# Patient Record
Sex: Male | Born: 1980 | Race: White | Hispanic: No | Marital: Married | Smoking: Former smoker
Health system: Southern US, Community
[De-identification: ages and names within clinical notes are randomized; demographics above are authoritative.]

## PROBLEM LIST (undated history)

## (undated) DIAGNOSIS — J302 Other seasonal allergic rhinitis: Secondary | ICD-10-CM

## (undated) HISTORY — PX: SHOULDER SURGERY: SHX246

## (undated) HISTORY — PX: WRIST SURGERY: SHX841

---
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
## 2014-09-25 ENCOUNTER — Other Ambulatory Visit: Payer: Self-pay | Admitting: Orthopaedic Surgery

## 2014-09-25 DIAGNOSIS — M25512 Pain in left shoulder: Secondary | ICD-10-CM

## 2014-10-04 ENCOUNTER — Ambulatory Visit
Admission: RE | Admit: 2014-10-04 | Discharge: 2014-10-04 | Disposition: A | Discharge status: Home / Self Care | Payer: BC Managed Care – PPO | Source: Ambulatory Visit | Attending: Orthopaedic Surgery | Admitting: Orthopaedic Surgery

## 2014-10-04 ENCOUNTER — Other Ambulatory Visit: Payer: Self-pay | Admitting: Orthopaedic Surgery

## 2014-10-04 DIAGNOSIS — M25512 Pain in left shoulder: Secondary | ICD-10-CM

## 2014-10-04 DIAGNOSIS — T1590XA Foreign body on external eye, part unspecified, unspecified eye, initial encounter: Secondary | ICD-10-CM

## 2014-10-04 MED ORDER — IOHEXOL 180 MG/ML  SOLN
15.0000 mL | Freq: Once | INTRAMUSCULAR | Status: AC | PRN
  Administered 2014-10-04: 15 mL via INTRA_ARTICULAR

## 2017-08-09 ENCOUNTER — Ambulatory Visit (INDEPENDENT_AMBULATORY_CARE_PROVIDER_SITE_OTHER): Payer: BC Managed Care – PPO | Admitting: Orthopaedic Surgery

## 2017-08-09 ENCOUNTER — Encounter (INDEPENDENT_AMBULATORY_CARE_PROVIDER_SITE_OTHER): Payer: Self-pay | Admitting: Orthopaedic Surgery

## 2017-08-09 DIAGNOSIS — M79622 Pain in left upper arm: Secondary | ICD-10-CM

## 2017-08-09 MED ORDER — PREDNISONE 10 MG (21) PO TBPK: ORAL_TABLET | ORAL | 0 refills | Status: DC

## 2017-08-09 NOTE — Progress Notes (Signed)
   Office Visit Note   Patient: Jeremiah Gray           Date of Birth: [birth year]           MRN: [id number] Visit Date: 08/09/2017              Requested by: No referring provider defined for this encounter. PCP: Patient, [name]   Assessment & Plan: Visit Diagnoses:  1. Left upper arm pain     Plan: Impression is left upper arm strain and overuse.  Recommend relative rest.  Prescription for prednisone.  Advil as needed.  Follow-up as needed.  Follow-Up Instructions: Return if symptoms worsen or fail to improve.   Orders:  No orders of the defined types were placed in this encounter.  Meds ordered this encounter  Medications  . predniSONE (STERAPRED UNI-PAK 21 TAB) 10 MG (21) TBPK tablet    Sig: Take as directed    Dispense:  21 tablet    Refill:  0      Procedures: No procedures performed   Clinical Data: No additional findings.   Subjective: Chief Complaint  Patient presents with  . Left Forearm - Pain    Patient is a 36 year old gentleman who I know from treating his labral tear comes in with a new complaint of discomfort in his proximal forearm and medial biceps.  He noticed this while weightlifting.  He has been taking anti-inflammatories and muscle relaxers which have not significantly helped.  Denies any numbness and tingling.  He denies any swelling or bruising.    Review of Systems  Constitutional: Negative.   All other systems reviewed and are negative.    Objective: Vital Signs: There were no vitals taken for this visit.  Physical Exam  Constitutional: He is oriented to person, place, and time. He appears well-developed and well-nourished.  Pulmonary/Chest: Effort normal.  Abdominal: Soft.  Neurological: He is alert and oriented to person, place, and time.  Skin: Skin is warm.  Psychiatric: He has a normal mood and affect. His behavior is normal. Judgment and thought content normal.  Nursing note and vitals reviewed.   Ortho Exam Left  exam shows normal hook test.  He has full strength with elbow flexion and extension.  He has normal strength with supination without pain.  He has tenderness along the medial intermuscular septum and along the ulnar nerve.  The cubital tunnel is stable to palpation.  There is no subluxation of the ulnar nerve. Specialty Comments:  No specialty comments available.  Imaging: No results found.   PMFS History: There are no active problems to display for this patient.  History reviewed. No pertinent past medical history.  History reviewed. No pertinent family history.  History reviewed. No pertinent surgical history. Social History   Occupational History  . Not on file  Tobacco Use  . Smoking status: Never Smoker  . Smokeless tobacco: Never Used  Substance and Sexual Activity  . Alcohol use: Not on file  . Drug use: Not on file  . Sexual activity: Not on file

## 2019-02-24 ENCOUNTER — Ambulatory Visit (INDEPENDENT_AMBULATORY_CARE_PROVIDER_SITE_OTHER): Payer: BC Managed Care – PPO | Admitting: Orthopaedic Surgery

## 2019-02-24 ENCOUNTER — Encounter: Payer: Self-pay | Admitting: Orthopaedic Surgery

## 2019-02-24 ENCOUNTER — Other Ambulatory Visit: Payer: Self-pay

## 2019-02-24 ENCOUNTER — Ambulatory Visit: Payer: Self-pay

## 2019-02-24 DIAGNOSIS — M25532 Pain in left wrist: Secondary | ICD-10-CM | POA: Diagnosis not present

## 2019-02-24 NOTE — Progress Notes (Signed)
Subjective: He is here for ultrasound-guided left wrist TFC injection.  Pain on the ulnar side of his wrist for about a month after doing a lifting activity.  History of previous tear treated with surgery successfully probably 18 years ago.  Objective: Tender to palpation directly over the TFC.  No significant pain over the ECU tendon.  Imaging: Ultrasound used on the ulnar side of his wrist show intact ECU tendon with no tenosynovitis.  The TFC is visualized and appears to have a hypoechoic effect in the deeper part of the structure.  This correlates to the location of his pain.  Procedure: Guided left wrist TFC injection: After sterile prep with Betadine, injected 1 cc 1% lidocaine without epinephrine and 20 mg methylprednisolone and to guide needle placement.  Follow-up as directed.

## 2019-02-24 NOTE — Progress Notes (Signed)
   Office Visit Note   Patient: Jeremiah Gray           Date of Birth: [birth year]           MRN: [id number] Visit Date: 02/24/2019              Requested by: No referring provider defined for this encounter. PCP: Patient, [name]   Assessment & Plan: Visit Diagnoses:  1. Pain in left wrist     Plan: Impression is questionable TFCC injury left wrist.  We have offered the patient the choice of proceeding with cortisone injection versus MR arthrogram to further assess the TFCC.  He would like to try the injection first.  We will refer the patient to Dr. Prince Rome for an ultrasound-guided cortisone injection to the TFCC.  He will follow-up with [place] as needed.  Call with concerns or questions in the meantime  Follow-Up Instructions: Return if symptoms worsen or fail to improve.   Orders:  Orders Placed This Encounter  Procedures  . XR Wrist Complete Left   No orders of the defined types were placed in this encounter.     Procedures: No procedures performed   Clinical Data: No additional findings.   Subjective: Chief Complaint  Patient presents with  . Left Wrist - Pain    HPI patient is a pleasant 38 year old right-hand-dominant gentleman who presents our clinic today with recurrent left wrist pain.  History of TFCC tear with subsequent surgical intervention by Dr. Nedra Hai at [facility] in 2007.  He is doing well during the postoperative period until recently.  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  The pain he has is intermittent and worse with turning doorknobs as well as resistance of the wrist.  He has noticed decreased grip strength secondary to pain and weakness.  He gets some relief with stretching exercises.  He does note an occasional tingle to the ulnar side of his wrist.  Review of Systems as detailed in HPI.  All others reviewed and are negative.   Objective: Vital  Signs: There were no vitals taken for this visit.  Physical Exam well-developed and well-nourished gentleman in no acute distress.  Alert and oriented x3.  Ortho Exam examination of his right wrist reveals increased ulnar-sided pain with wrist flexion, extension and radial deviation.  He has slight pain as well with full supination.  He has mild to moderate tenderness over the TFCC.  Negative Phalen and negative Tinel at the wrist and elbow.  He is neurovascularly intact distally.  Specialty Comments:  No specialty comments available.  Imaging: Xr Wrist Complete Left  Result Date: 02/24/2019 No acute or structural abnormalities    PMFS History: Patient Active Problem List   Diagnosis Date Noted  . Pain in left wrist 02/24/2019   History reviewed. No pertinent past medical history.  History reviewed. No pertinent family history.  History reviewed. No pertinent surgical history. Social History   Occupational History  . Not on file  Tobacco Use  . Smoking status: Never Smoker  . Smokeless tobacco: Never Used  Substance and Sexual Activity  . Alcohol use: Not on file  . Drug use: Not on file  . Sexual activity: Not on file

## 2020-06-12 ENCOUNTER — Ambulatory Visit: Payer: BC Managed Care – PPO | Admitting: Orthopaedic Surgery

## 2020-06-14 ENCOUNTER — Ambulatory Visit: Payer: Self-pay

## 2020-06-14 ENCOUNTER — Ambulatory Visit: Payer: BC Managed Care – PPO | Admitting: Orthopaedic Surgery

## 2020-06-14 DIAGNOSIS — M25511 Pain in right shoulder: Secondary | ICD-10-CM

## 2020-06-14 MED ORDER — LIDOCAINE HCL 2 % IJ SOLN
2.0000 mL | INTRAMUSCULAR | Status: AC | PRN
  Administered 2020-06-14: 09:00:00 2 mL

## 2020-06-14 MED ORDER — BUPIVACAINE HCL 0.25 % IJ SOLN
2.0000 mL | INTRAMUSCULAR | Status: AC | PRN
  Administered 2020-06-14: 09:00:00 2 mL via INTRA_ARTICULAR

## 2020-06-14 MED ORDER — METHYLPREDNISOLONE ACETATE 40 MG/ML IJ SUSP
40.0000 mg | INTRAMUSCULAR | Status: AC | PRN
  Administered 2020-06-14: 09:00:00 40 mg via INTRA_ARTICULAR

## 2020-06-14 NOTE — Progress Notes (Signed)
Office Visit Note   Patient: Jeremiah Gray           Date of Birth: 1980-09-30           MRN: [id number] Visit Date: 06/14/2020              Requested by: No referring provider defined for this encounter. PCP: Patient, [name]   Assessment & Plan: Visit Diagnoses:  1. Right shoulder pain, unspecified chronicity     Plan: Impression is right shoulder subacromial bursitis.  We will proceed with right shoulder subacromial cortisone injection today.  He will follow up as needed.  Follow-Up Instructions: Return if symptoms worsen or fail to improve.   Orders:  Orders Placed This Encounter  Procedures  . Large Joint Inj: R subacromial bursa  . XR Shoulder Right   No orders of the defined types were placed in this encounter.     Procedures: Large Joint Inj: R subacromial bursa on 06/14/2020 9:14 AM Indications: pain Details: 22 G needle Medications: 2 mL lidocaine 2 %; 2 mL bupivacaine 0.25 %; 40 mg methylPREDNISolone acetate 40 MG/ML Outcome: tolerated well, no immediate complications Patient was prepped and draped in the usual sterile fashion.       Clinical Data: No additional findings.   Subjective: Chief Complaint  Patient presents with  . Right Shoulder - Pain    HPI patient is a very pleasant 39 year old gentleman who comes in today with right shoulder pain.  This is been ongoing for the past 2 months and has progressively worsened.  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  The pain he has is to the top of the shoulder and radiates into the deltoid.  He describes this as a constant dull ache with increased pain while lying on the right side, using the clutch in his car, forward flexion, abduction and internal rotation of the shoulder.  He has tried ibuprofen without significant relief of symptoms.  No numbness, tingling or burning to the right upper extremity.  Does note previous left shoulder arthroscopic debridement  of the labral tear few years ago and is doing well there.  Review of Systems as detailed in HPI.  All other reviewed and are negative.   Objective: Vital Signs: There were no vitals taken for this visit.  Physical Exam well-developed well-nourished gentleman in no acute distress.  Alert and oriented x3.  Ortho Exam examination of the right shoulder reveals full active range of motion in all planes.  Does have pain with the extremes of forward flexion.  He can internally rotate to T12.  He has a minimally positive empty can and O'Brien's test.  No pain with cross body abduction.  No tenderness to the [facility] joint.  Full strength throughout.  He is neurovascular intact distally.  Specialty Comments:  No specialty comments available.  Imaging: XR Shoulder Right  Result Date: 06/14/2020 Mild degenerative changes the [facility] joint    PMFS History: Patient Active Problem List   Diagnosis Date Noted  . Pain in left wrist 02/24/2019   No past medical history on file.  No family history on file.  No past surgical history on file. Social History   Occupational History  . Not on file  Tobacco Use  . Smoking status: Never Smoker  . Smokeless tobacco: Never Used  Substance and Sexual Activity  . Alcohol use: Not on file  . Drug use: Not on file  .  Sexual activity: Not on file

## 2020-07-05 ENCOUNTER — Telehealth: Payer: Self-pay | Admitting: Orthopaedic Surgery

## 2020-07-05 NOTE — Telephone Encounter (Signed)
Pt is s/p a right shoulder injection on 06/14/20 that was not helpful. He is wanting to know if an MRI would be the next step please advise.

## 2020-07-05 NOTE — Telephone Encounter (Signed)
Patient called. He would like a MRI. Says he is still having pain. His call back number is [phone]

## 2020-07-05 NOTE — Telephone Encounter (Signed)
Let's have him return for appt. Thanks.

## 2020-07-08 NOTE — Telephone Encounter (Signed)
Called patient no answer. LMOM.  Needs appt with Dr. Roda Shutters.

## 2020-07-12 ENCOUNTER — Encounter: Payer: Self-pay | Admitting: Orthopaedic Surgery

## 2020-07-12 ENCOUNTER — Other Ambulatory Visit: Payer: Self-pay

## 2020-07-12 ENCOUNTER — Ambulatory Visit: Payer: BC Managed Care – PPO | Admitting: Orthopaedic Surgery

## 2020-07-12 ENCOUNTER — Ambulatory Visit: Payer: Self-pay

## 2020-07-12 VITALS — Ht 70.0 in | Wt 205.0 lb

## 2020-07-12 DIAGNOSIS — M25511 Pain in right shoulder: Secondary | ICD-10-CM | POA: Diagnosis not present

## 2020-07-12 DIAGNOSIS — G8929 Other chronic pain: Secondary | ICD-10-CM

## 2020-07-12 NOTE — Progress Notes (Signed)
° °  Office Visit Note   Patient: Jeremiah Gray           Date of Birth: 1981-06-20           MRN: [id number] Visit Date: 07/12/2020              Requested by: No referring provider defined for this encounter. PCP: Patient, [name]   Assessment & Plan: Visit Diagnoses:  1. Chronic right shoulder pain     Plan: Impression is chronic right shoulder pain with probable biceps and/or labral pathology.  We will refer the patient to Dr. Prince Rome for intra-articular cortisone injection.  I would like to check his response to the injection in a month.  Follow-Up Instructions: Return in 4 weeks (on 08/09/2020).   Orders:  Orders Placed This Encounter  Procedures   US Guided Needle Placement - No Linked Charges   No orders of the defined types were placed in this encounter.     Procedures: No procedures performed   Clinical Data: No additional findings.   Subjective: Chief Complaint  Patient presents with   Right Shoulder - Pain    HPI patient is a pleasant 39 year old gentleman who comes in today with continued right shoulder pain.  He has been dealing with this for the past almost 3 months.  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  He was seen in our office about a month ago where right shoulder subacromial cortisone injection was performed.  He notes no relief of symptoms following the injection.  He continues to have pain primarily to the top of the shoulder and into the deltoid.  He has a history of left shoulder arthroscopic debridement of the labral tear.  Review of Systems as detailed in HPI.  All others reviewed and are negative.   Objective: Vital Signs: Ht 5\' 10"  (1.778 m)    Wt 205 lb (93 kg)    BMI 29.41 kg/m   Physical Exam well-developed well-nourished gentleman in no acute distress.  Alert and oriented x3.  Ortho Exam right shoulder exam shows full range of motion all planes.  He does have pain with speeds and Yergason's testing.   Negative O'Brien. He has full strength throughout rotator cuff.  Is neurovascular intact distally.  Fairly tender over the biceps tendon.  Pain with loading of the posterior labrum.  Specialty Comments:  No specialty comments available.  Imaging: No new imaging   PMFS History: Patient Active Problem List   Diagnosis Date Noted   Pain in left wrist 02/24/2019   No past medical history on file.  No family history on file.  No past surgical history on file. Social History   Occupational History   Not on file  Tobacco Use   Smoking status: Never Smoker   Smokeless tobacco: Never Used  Substance and Sexual Activity   Alcohol use: Not on file   Drug use: Not on file   Sexual activity: Not on file

## 2020-07-12 NOTE — Progress Notes (Signed)
Subjective: Patient is here for ultrasound-guided intra-articular right glenohumeral injection. Pain especially when reaching laterally or behind his head. 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  Objective: Full active range of motion. No significant AC joint tenderness. He has pain with AC crossover test but the pain is in the posterior lateral shoulder.  Procedure: Ultrasound-guided right glenohumeral injection: After sterile prep with Betadine, injected 8 cc 1% lidocaine without epinephrine and 40 mg methylprednisolone using a 22-gauge spinal needle, passing the needle from posterior approach into the glenohumeral joint. Injectate was seen filling the joint capsule. He had good immediate relief.

## 2020-08-04 ENCOUNTER — Ambulatory Visit: Payer: BC Managed Care – PPO | Attending: Internal Medicine

## 2020-08-04 DIAGNOSIS — Z23 Encounter for immunization: Secondary | ICD-10-CM

## 2020-08-04 NOTE — Progress Notes (Signed)
   Covid-19 Vaccination Clinic  Name:  Jeremiah Gray    MRN: [id number] DOB: 1981/05/20  08/04/2020  Jeremiah Gray was observed post Covid-19 immunization for 15 minutes without incident. He was provided with Vaccine Information Sheet and instruction to access the V-Safe system.   Jeremiah Gray was instructed to call 911 with any severe reactions post vaccine: Marland Kitchen Difficulty breathing  . Swelling of face and throat  . A fast heartbeat  . A bad rash all over body  . Dizziness and weakness   Immunizations Administered    Name Date Dose VIS Date Route   Pfizer COVID-19 Vaccine 08/04/2020 12:29 PM 0.3 mL 07/10/2020 Intramuscular   Manufacturer: [employer], [employer]   Lot: [id number]   NDC: [address]-3

## 2020-08-13 ENCOUNTER — Encounter: Payer: Self-pay | Admitting: Orthopaedic Surgery

## 2020-08-13 ENCOUNTER — Other Ambulatory Visit: Payer: Self-pay | Admitting: Radiology

## 2020-08-13 DIAGNOSIS — G8929 Other chronic pain: Secondary | ICD-10-CM

## 2020-08-13 DIAGNOSIS — M25511 Pain in right shoulder: Secondary | ICD-10-CM

## 2020-08-23 ENCOUNTER — Telehealth: Payer: Self-pay | Admitting: Orthopaedic Surgery

## 2020-08-23 NOTE — Telephone Encounter (Signed)
Called pt and left vm for pt to call and set appt after 12/9 with Dr. Roda Shutters for MRI review.

## 2020-08-26 ENCOUNTER — Telehealth: Payer: Self-pay | Admitting: Orthopaedic Surgery

## 2020-08-26 NOTE — Telephone Encounter (Signed)
Called and left 2nd vm for pt to call and set MRI review appt with Dr, Roda Shutters after 12/09. Will try back later

## 2020-08-29 ENCOUNTER — Ambulatory Visit
Admission: RE | Admit: 2020-08-29 | Discharge: 2020-08-29 | Disposition: A | Discharge status: Home / Self Care | Payer: BC Managed Care – PPO | Source: Ambulatory Visit | Attending: Orthopaedic Surgery | Admitting: Orthopaedic Surgery

## 2020-08-29 DIAGNOSIS — G8929 Other chronic pain: Secondary | ICD-10-CM

## 2020-08-29 DIAGNOSIS — M25511 Pain in right shoulder: Secondary | ICD-10-CM

## 2020-08-29 MED ORDER — IOPAMIDOL (ISOVUE-M 200) INJECTION 41%
12.0000 mL | Freq: Once | INTRAMUSCULAR | Status: AC
  Administered 2020-08-29: 12 mL via INTRA_ARTICULAR

## 2020-09-03 ENCOUNTER — Other Ambulatory Visit: Payer: Self-pay

## 2020-09-03 ENCOUNTER — Ambulatory Visit (INDEPENDENT_AMBULATORY_CARE_PROVIDER_SITE_OTHER): Payer: BC Managed Care – PPO | Admitting: Orthopaedic Surgery

## 2020-09-03 DIAGNOSIS — M19011 Primary osteoarthritis, right shoulder: Secondary | ICD-10-CM | POA: Diagnosis not present

## 2020-09-03 DIAGNOSIS — M75101 Unspecified rotator cuff tear or rupture of right shoulder, not specified as traumatic: Secondary | ICD-10-CM | POA: Insufficient documentation

## 2020-09-03 NOTE — Progress Notes (Signed)
° °  Office Visit Note   Patient: Jeremiah Gray           Date of Birth: [birth year]           MRN: [id number] Visit Date: 09/03/2020              Requested by: No referring provider defined for this encounter. PCP: Patient, [name]   Assessment & Plan: Visit Diagnoses:  1. Tear of right supraspinatus tendon   2. Arthrosis of right acromioclavicular joint     Plan: MRI shows partial with a near full thickness supraspinatus tear anteriorly and moderate AC joint arthrosis with edema of the distal clavicle.  These findings were reviewed with the patient.  He has had worsening pain since mid July.  Given his lifestyle and activity level which he does a lot of heavy lifting and working out and weightlifting I have recommended arthroscopic repair and distal clavicle excision with subacromial decompression.  Risk benefits alternatives rehab recovery to the surgery are reviewed with the patient detail.  He would like to have this done in early January.  We will call him in the near future to get this set up.  Follow-Up Instructions: Return if symptoms worsen or fail to improve.   Orders:  No orders of the defined types were placed in this encounter.  No orders of the defined types were placed in this encounter.     Procedures: No procedures performed   Clinical Data: No additional findings.   Subjective: Chief Complaint  Patient presents with   Right Shoulder - Follow-up    Merlin returns today for MRI review of the right shoulder.  Denies any changes.   Review of Systems   Objective: Vital Signs: There were no vitals taken for this visit.  Physical Exam  Ortho Exam Right shoulder shows moderate pain with empty can and cross body adduction.  AC joint is mildly tender.  Range of motion is well-preserved. Specialty Comments:  No specialty comments available.  Imaging: No results found.   PMFS History: Patient Active Problem List   Diagnosis Date Noted   Pain in left  wrist 02/24/2019   No past medical history on file.  No family history on file.  No past surgical history on file. Social History   Occupational History   Not on file  Tobacco Use   Smoking status: Never Smoker   Smokeless tobacco: Never Used  Substance and Sexual Activity   Alcohol use: Not on file   Drug use: Not on file   Sexual activity: Not on file

## 2020-09-16 ENCOUNTER — Encounter: Payer: Self-pay | Admitting: Orthopaedic Surgery

## 2020-09-19 NOTE — Telephone Encounter (Signed)
I called patient and left voice mail for return call to schedule. 

## 2020-10-04 ENCOUNTER — Encounter (HOSPITAL_BASED_OUTPATIENT_CLINIC_OR_DEPARTMENT_OTHER): Payer: Self-pay | Admitting: Orthopaedic Surgery

## 2020-10-04 ENCOUNTER — Other Ambulatory Visit: Payer: Self-pay

## 2020-10-08 ENCOUNTER — Other Ambulatory Visit (HOSPITAL_COMMUNITY)
Admission: RE | Admit: 2020-10-08 | Discharge: 2020-10-08 | Disposition: A | Discharge status: Home / Self Care | Payer: BC Managed Care – PPO | Source: Ambulatory Visit | Attending: Orthopaedic Surgery | Admitting: Orthopaedic Surgery

## 2020-10-08 DIAGNOSIS — M7551 Bursitis of right shoulder: Secondary | ICD-10-CM | POA: Diagnosis not present

## 2020-10-08 DIAGNOSIS — Z87891 Personal history of nicotine dependence: Secondary | ICD-10-CM | POA: Diagnosis not present

## 2020-10-08 DIAGNOSIS — Z01812 Encounter for preprocedural laboratory examination: Secondary | ICD-10-CM | POA: Diagnosis not present

## 2020-10-08 DIAGNOSIS — Z20822 Contact with and (suspected) exposure to covid-19: Secondary | ICD-10-CM | POA: Insufficient documentation

## 2020-10-08 DIAGNOSIS — Z01818 Encounter for other preprocedural examination: Secondary | ICD-10-CM | POA: Insufficient documentation

## 2020-10-08 DIAGNOSIS — M25811 Other specified joint disorders, right shoulder: Secondary | ICD-10-CM | POA: Diagnosis not present

## 2020-10-08 DIAGNOSIS — M19011 Primary osteoarthritis, right shoulder: Secondary | ICD-10-CM | POA: Diagnosis not present

## 2020-10-09 ENCOUNTER — Other Ambulatory Visit: Payer: Self-pay

## 2020-10-09 LAB — SARS CORONAVIRUS 2 (TAT 6-24 HRS): SARS Coronavirus 2: NEGATIVE

## 2020-10-10 NOTE — Progress Notes (Signed)

## 2020-10-10 NOTE — Anesthesia Preprocedure Evaluation (Addendum)
Anesthesia Evaluation  Patient identified by MRN, date of birth, ID band Patient awake    Reviewed: Allergy & Precautions, NPO status , Patient's Chart, lab work & pertinent test results  History of Anesthesia Complications Negative for: history of anesthetic complications  Airway Mallampati: I  TM Distance: >3 FB Neck ROM: Full    Dental no notable dental hx.    Pulmonary former smoker,    Pulmonary exam normal        Cardiovascular negative cardio ROS Normal cardiovascular exam     Neuro/Psych negative neurological ROS  negative psych ROS   GI/Hepatic negative GI ROS, Neg liver ROS,   Endo/Other  negative endocrine ROS  Renal/GU negative Renal ROS  negative genitourinary   Musculoskeletal right shoulder rotator cuff tear   Abdominal   Peds  Hematology negative hematology ROS (+)   Anesthesia Other Findings Day of surgery medications reviewed with patient.  Reproductive/Obstetrics negative OB ROS                            Anesthesia Physical Anesthesia Plan  ASA: I  Anesthesia Plan: General   Post-op Pain Management: GA combined w/ Regional for post-op pain   Induction: Intravenous  PONV Risk Score and Plan: Treatment may vary due to age or medical condition, Ondansetron, Dexamethasone and Midazolam  Airway Management Planned: Oral ETT  Additional Equipment: None  Intra-op Plan:   Post-operative Plan: Extubation in OR  Informed Consent: I have reviewed the patients History and Physical, chart, labs and discussed the procedure including the risks, benefits and alternatives for the proposed anesthesia with the patient or authorized representative who has indicated his/her understanding and acceptance.     Dental advisory given  Plan Discussed with: CRNA  Anesthesia Plan Comments:        Anesthesia Quick Evaluation

## 2020-10-11 ENCOUNTER — Ambulatory Visit (HOSPITAL_BASED_OUTPATIENT_CLINIC_OR_DEPARTMENT_OTHER): Payer: BC Managed Care – PPO | Admitting: Anesthesiology

## 2020-10-11 ENCOUNTER — Other Ambulatory Visit: Payer: Self-pay

## 2020-10-11 ENCOUNTER — Encounter (HOSPITAL_BASED_OUTPATIENT_CLINIC_OR_DEPARTMENT_OTHER): Payer: Self-pay | Admitting: Orthopaedic Surgery

## 2020-10-11 ENCOUNTER — Encounter (HOSPITAL_BASED_OUTPATIENT_CLINIC_OR_DEPARTMENT_OTHER)
Admission: RE | Disposition: A | Discharge status: Home / Self Care | Payer: Self-pay | Source: Home / Self Care | Attending: Orthopaedic Surgery

## 2020-10-11 ENCOUNTER — Ambulatory Visit (HOSPITAL_BASED_OUTPATIENT_CLINIC_OR_DEPARTMENT_OTHER)
Admission: RE | Admit: 2020-10-11 | Discharge: 2020-10-11 | Disposition: A | Discharge status: Home / Self Care | Payer: BC Managed Care – PPO | Attending: Orthopaedic Surgery | Admitting: Orthopaedic Surgery

## 2020-10-11 DIAGNOSIS — M19011 Primary osteoarthritis, right shoulder: Secondary | ICD-10-CM | POA: Diagnosis not present

## 2020-10-11 DIAGNOSIS — M25811 Other specified joint disorders, right shoulder: Secondary | ICD-10-CM | POA: Insufficient documentation

## 2020-10-11 DIAGNOSIS — Z87891 Personal history of nicotine dependence: Secondary | ICD-10-CM | POA: Insufficient documentation

## 2020-10-11 DIAGNOSIS — M75101 Unspecified rotator cuff tear or rupture of right shoulder, not specified as traumatic: Secondary | ICD-10-CM | POA: Diagnosis present

## 2020-10-11 DIAGNOSIS — Z20822 Contact with and (suspected) exposure to covid-19: Secondary | ICD-10-CM | POA: Insufficient documentation

## 2020-10-11 DIAGNOSIS — M7551 Bursitis of right shoulder: Secondary | ICD-10-CM | POA: Insufficient documentation

## 2020-10-11 DIAGNOSIS — Z01812 Encounter for preprocedural laboratory examination: Secondary | ICD-10-CM | POA: Insufficient documentation

## 2020-10-11 HISTORY — DX: Other seasonal allergic rhinitis: J30.2

## 2020-10-11 SURGERY — SHOULDER ARTHROSCOPY WITH SUBACROMIAL DECOMPRESSION AND DISTAL CLAVICLE EXCISION
Anesthesia: General | Site: Shoulder | Laterality: Right

## 2020-10-11 MED ORDER — BUPIVACAINE LIPOSOME 1.3 % IJ SUSP
INTRAMUSCULAR | Status: DC | PRN
  Administered 2020-10-11: 10 mL via PERINEURAL

## 2020-10-11 MED ORDER — LIDOCAINE 2% (20 MG/ML) 5 ML SYRINGE
INTRAMUSCULAR | Status: AC
  Filled 2020-10-11: qty 5

## 2020-10-11 MED ORDER — FENTANYL CITRATE (PF) 100 MCG/2ML IJ SOLN
INTRAMUSCULAR | Status: AC
  Filled 2020-10-11: qty 2

## 2020-10-11 MED ORDER — CEFAZOLIN SODIUM-DEXTROSE 2-4 GM/100ML-% IV SOLN
2.0000 g | INTRAVENOUS | Status: AC
  Administered 2020-10-11: 2 g via INTRAVENOUS

## 2020-10-11 MED ORDER — PROPOFOL 500 MG/50ML IV EMUL
INTRAVENOUS | Status: AC
  Filled 2020-10-11: qty 50

## 2020-10-11 MED ORDER — OXYCODONE HCL 5 MG/5ML PO SOLN: 5.0000 mg | Freq: Once | ORAL | Status: DC | PRN

## 2020-10-11 MED ORDER — ROCURONIUM BROMIDE 10 MG/ML (PF) SYRINGE
PREFILLED_SYRINGE | INTRAVENOUS | Status: AC
  Filled 2020-10-11: qty 10

## 2020-10-11 MED ORDER — ACETAMINOPHEN 500 MG PO TABS
1000.0000 mg | ORAL_TABLET | Freq: Once | ORAL | Status: AC
  Administered 2020-10-11: 1000 mg via ORAL

## 2020-10-11 MED ORDER — OXYCODONE HCL 5 MG PO TABS: 5.0000 mg | ORAL_TABLET | Freq: Once | ORAL | Status: DC | PRN

## 2020-10-11 MED ORDER — CEFAZOLIN SODIUM-DEXTROSE 2-4 GM/100ML-% IV SOLN
INTRAVENOUS | Status: AC
  Filled 2020-10-11: qty 100

## 2020-10-11 MED ORDER — FENTANYL CITRATE (PF) 100 MCG/2ML IJ SOLN: 25.0000 ug | INTRAMUSCULAR | Status: DC | PRN

## 2020-10-11 MED ORDER — ACETAMINOPHEN 500 MG PO TABS
ORAL_TABLET | ORAL | Status: AC
  Filled 2020-10-11: qty 1

## 2020-10-11 MED ORDER — ONDANSETRON HCL 4 MG/2ML IJ SOLN
INTRAMUSCULAR | Status: AC
  Filled 2020-10-11: qty 2

## 2020-10-11 MED ORDER — BUPIVACAINE-EPINEPHRINE 0.25% -1:200000 IJ SOLN
INTRAMUSCULAR | Status: DC | PRN
  Administered 2020-10-11: 30 mL

## 2020-10-11 MED ORDER — DEXAMETHASONE SODIUM PHOSPHATE 10 MG/ML IJ SOLN
INTRAMUSCULAR | Status: AC
  Filled 2020-10-11: qty 1

## 2020-10-11 MED ORDER — SUGAMMADEX SODIUM 500 MG/5ML IV SOLN
INTRAVENOUS | Status: DC | PRN
  Administered 2020-10-11: 200 mg via INTRAVENOUS

## 2020-10-11 MED ORDER — LACTATED RINGERS IV SOLN: INTRAVENOUS | Status: DC

## 2020-10-11 MED ORDER — OXYCODONE-ACETAMINOPHEN 5-325 MG PO TABS: 1.0000 | ORAL_TABLET | Freq: Three times a day (TID) | ORAL | 0 refills | Status: AC | PRN

## 2020-10-11 MED ORDER — METHOCARBAMOL 500 MG PO TABS: 500.0000 mg | ORAL_TABLET | Freq: Four times a day (QID) | ORAL | 2 refills | Status: AC | PRN

## 2020-10-11 MED ORDER — GLYCOPYRROLATE 0.2 MG/ML IJ SOLN
INTRAMUSCULAR | Status: DC | PRN
  Administered 2020-10-11: .2 mg via INTRAVENOUS

## 2020-10-11 MED ORDER — MIDAZOLAM HCL 2 MG/2ML IJ SOLN
1.0000 mg | Freq: Once | INTRAMUSCULAR | Status: AC
  Administered 2020-10-11: 1 mg via INTRAVENOUS

## 2020-10-11 MED ORDER — GLYCOPYRROLATE PF 0.2 MG/ML IJ SOSY
PREFILLED_SYRINGE | INTRAMUSCULAR | Status: AC
  Filled 2020-10-11: qty 1

## 2020-10-11 MED ORDER — DEXAMETHASONE SODIUM PHOSPHATE 10 MG/ML IJ SOLN
INTRAMUSCULAR | Status: DC | PRN
  Administered 2020-10-11: 10 mg via INTRAVENOUS

## 2020-10-11 MED ORDER — MIDAZOLAM HCL 2 MG/2ML IJ SOLN
INTRAMUSCULAR | Status: AC
  Filled 2020-10-11: qty 2

## 2020-10-11 MED ORDER — LIDOCAINE HCL (CARDIAC) PF 100 MG/5ML IV SOSY
PREFILLED_SYRINGE | INTRAVENOUS | Status: DC | PRN
  Administered 2020-10-11: 100 mg via INTRAVENOUS

## 2020-10-11 MED ORDER — PROPOFOL 10 MG/ML IV BOLUS
INTRAVENOUS | Status: DC | PRN
  Administered 2020-10-11: 200 mg via INTRAVENOUS

## 2020-10-11 MED ORDER — FENTANYL CITRATE (PF) 100 MCG/2ML IJ SOLN
50.0000 ug | Freq: Once | INTRAMUSCULAR | Status: AC
  Administered 2020-10-11: 50 ug via INTRAVENOUS

## 2020-10-11 MED ORDER — FENTANYL CITRATE (PF) 100 MCG/2ML IJ SOLN
INTRAMUSCULAR | Status: DC | PRN
  Administered 2020-10-11: 100 ug via INTRAVENOUS

## 2020-10-11 MED ORDER — ROCURONIUM BROMIDE 100 MG/10ML IV SOLN
INTRAVENOUS | Status: DC | PRN
  Administered 2020-10-11: 60 mg via INTRAVENOUS

## 2020-10-11 MED ORDER — ONDANSETRON HCL 4 MG/2ML IJ SOLN
INTRAMUSCULAR | Status: DC | PRN
  Administered 2020-10-11: 4 mg via INTRAVENOUS

## 2020-10-11 MED ORDER — MIDAZOLAM HCL 5 MG/5ML IJ SOLN
INTRAMUSCULAR | Status: DC | PRN
  Administered 2020-10-11: 2 mg via INTRAVENOUS

## 2020-10-11 MED ORDER — BUPIVACAINE-EPINEPHRINE (PF) 0.5% -1:200000 IJ SOLN
INTRAMUSCULAR | Status: DC | PRN
  Administered 2020-10-11: 15 mL via PERINEURAL

## 2020-10-11 MED ORDER — SODIUM CHLORIDE 0.9 % IR SOLN
Status: DC | PRN
  Administered 2020-10-11: 9000 mL

## 2020-10-11 MED ORDER — PROMETHAZINE HCL 25 MG/ML IJ SOLN: 6.2500 mg | INTRAMUSCULAR | Status: DC | PRN

## 2020-10-11 SURGICAL SUPPLY — 59 items
BENZOIN TINCTURE PRP APPL 2/3 (GAUZE/BANDAGES/DRESSINGS) IMPLANT
BLADE EXCALIBUR 4.0X13 (MISCELLANEOUS) IMPLANT
BLADE SURG 15 STRL LF DISP TIS (BLADE) ×1 IMPLANT
BLADE SURG 15 STRL SS (BLADE) ×1
BURR OVAL 8 FLU 4.0X13 (MISCELLANEOUS) ×2 IMPLANT
CANNULA 5.75X71 LONG (CANNULA) ×2 IMPLANT
CANNULA SHOULDER 7CM (CANNULA) ×2 IMPLANT
CANNULA TWIST IN 8.25X7CM (CANNULA) IMPLANT
CANNULA TWIST IN 8.25X9CM (CANNULA) IMPLANT
CLSR STERI-STRIP ANTIMIC 1/2X4 (GAUZE/BANDAGES/DRESSINGS) IMPLANT
COOLER ICEMAN CLASSIC (MISCELLANEOUS) ×2 IMPLANT
COVER WAND RF STERILE (DRAPES) IMPLANT
DECANTER SPIKE VIAL GLASS SM (MISCELLANEOUS) IMPLANT
DISSECTOR  3.8MM X 13CM (MISCELLANEOUS) ×1
DISSECTOR 3.8MM X 13CM (MISCELLANEOUS) ×1 IMPLANT
DRAPE IMP U-DRAPE 54X76 (DRAPES) ×2 IMPLANT
DRAPE INCISE IOBAN 66X45 STRL (DRAPES) ×2 IMPLANT
DRAPE STERI 35X30 U-POUCH (DRAPES) ×2 IMPLANT
DRAPE SURG 17X23 STRL (DRAPES) ×2 IMPLANT
DRAPE U-SHAPE 47X51 STRL (DRAPES) ×2 IMPLANT
DRAPE U-SHAPE 76X120 STRL (DRAPES) ×4 IMPLANT
DRSG PAD ABDOMINAL 8X10 ST (GAUZE/BANDAGES/DRESSINGS) ×4 IMPLANT
DURAPREP 26ML APPLICATOR (WOUND CARE) ×4 IMPLANT
ELECT REM PT RETURN 9FT ADLT (ELECTROSURGICAL) ×2
ELECTRODE REM PT RTRN 9FT ADLT (ELECTROSURGICAL) ×1 IMPLANT
GAUZE SPONGE 4X4 12PLY STRL (GAUZE/BANDAGES/DRESSINGS) ×4 IMPLANT
GAUZE XEROFORM 1X8 LF (GAUZE/BANDAGES/DRESSINGS) ×2 IMPLANT
GLOVE ECLIPSE 7.0 STRL STRAW (GLOVE) ×2 IMPLANT
GLOVE SURG NEOP MICRO LF SZ7.5 (GLOVE) ×2 IMPLANT
GLOVE SURG SYN 7.5  E (GLOVE) ×2
GLOVE SURG SYN 7.5 E (GLOVE) ×2 IMPLANT
GLOVE SURG UNDER POLY LF SZ7 (GLOVE) ×2 IMPLANT
GOWN STRL REIN XL XLG (GOWN DISPOSABLE) ×4 IMPLANT
GOWN STRL REUS W/ TWL LRG LVL3 (GOWN DISPOSABLE) ×1 IMPLANT
GOWN STRL REUS W/ TWL XL LVL3 (GOWN DISPOSABLE) IMPLANT
GOWN STRL REUS W/TWL LRG LVL3 (GOWN DISPOSABLE) ×1
GOWN STRL REUS W/TWL XL LVL3 (GOWN DISPOSABLE)
MANIFOLD NEPTUNE II (INSTRUMENTS) ×2 IMPLANT
NEEDLE SCORPION MULTI FIRE (NEEDLE) IMPLANT
PACK ARTHROSCOPY DSU (CUSTOM PROCEDURE TRAY) ×2 IMPLANT
PACK BASIN DAY SURGERY FS (CUSTOM PROCEDURE TRAY) ×2 IMPLANT
PAD COLD SHLDR WRAP-ON (PAD) ×2 IMPLANT
PORT APPOLLO RF 90DEGREE MULTI (SURGICAL WAND) ×2 IMPLANT
SHEET MEDIUM DRAPE 40X70 STRL (DRAPES) ×4 IMPLANT
SLEEVE SCD COMPRESS KNEE MED (MISCELLANEOUS) ×2 IMPLANT
SLING ARM FOAM STRAP LRG (SOFTGOODS) ×2 IMPLANT
SUT ETHILON 3 0 PS 1 (SUTURE) ×2 IMPLANT
SUT FIBERWIRE #2 38 T-5 BLUE (SUTURE)
SUT TIGER TAPE 7 IN WHITE (SUTURE) IMPLANT
SUTURE FIBERWR #2 38 T-5 BLUE (SUTURE) IMPLANT
SUTURE TAPE 1.3 40 TPR END (SUTURE) IMPLANT
SUTURE TAPE TIGERLINK 1.3MM BL (SUTURE) IMPLANT
SUTURETAPE 1.3 40 TPR END (SUTURE)
SUTURETAPE TIGERLINK 1.3MM BL (SUTURE)
SYR 50ML LL SCALE MARK (SYRINGE) ×2 IMPLANT
TAPE FIBER 2MM 7IN #2 BLUE (SUTURE) IMPLANT
TOWEL GREEN STERILE FF (TOWEL DISPOSABLE) ×4 IMPLANT
TUBE CONNECTING 20X1/4 (TUBING) ×2 IMPLANT
TUBING ARTHROSCOPY IRRIG 16FT (MISCELLANEOUS) ×4 IMPLANT

## 2020-10-11 NOTE — Op Note (Signed)
   Date of Surgery: 10/11/2020  INDICATIONS: The patient is a 40 year old male with right shoulder pain that has failed conservative treatment;  The patient did consent to the procedure after discussion of the risks and benefits.  PREOPERATIVE DIAGNOSIS:  1. Right AC joint arthrosis 2. Right supraspinatus tendinosis 3. Right shoulder impingement  POSTOPERATIVE DIAGNOSIS: Same.  PROCEDURE:  1. Arthroscopic right distal clavicle excision 2. Arthroscopic extensive debridement of labrum, rotator cuff, rotator interval, subacromial bursitis 3. Arthroscopic right subacromial decompression with acromioplasty  SURGEON: N. Glee Arvin, M.D.  ASSIST: Oneal Grout, PA-C  ANESTHESIA:  general, regional  IV FLUIDS AND URINE: See anesthesia.  ESTIMATED BLOOD LOSS: minimal mL.  IMPLANTS: none  COMPLICATIONS: None.  DESCRIPTION OF PROCEDURE: The patient was brought to the operating room and placed supine on the operating table.  The patient had been signed prior to the procedure and this was documented. The patient had the anesthesia placed by the anesthesiologist.  A time-out was performed to confirm that this was the correct patient, site, side and location. The patient did receive antibiotics prior to the incision and was re-dosed during the procedure as needed at indicated intervals.  The patient was then positioned into the beach chair position with all bony prominences well padded and neutral C spine. The patient had the operative extremity prepped and draped in the standard surgical fashion.    Incisions were made for arthroscopic shoulder portals.  Diagnostic shoulder arthroscopy carried out revealed some mild fraying of the posterior superior labrum.  Superior labral complex was intact.  The anterior labrum demonstrated some mild fraying and degenerative wear as well.  This was all gently debrided.  The chondral surface of the glenohumeral joint was intact.  There is no loose bodies.   Biceps tendon itself was unremarkable.  Subscapularis tendon was unremarkable.  Arthroscope was then repositioned into the subacromial space.  Subacromial decompression along with acromioplasty was performed.  We then used an [employer] wand to skeletonized the [facility] joint and he had a large distal clavicle osteophytes predisposing him to impingement.  Distal clavicle excision was performed using a high-speed bur taking approximately 8 mm of distal clavicle.  Care was taken not to disturb the superior AC ligament or the posterior AC ligament.  After this was done we then gently debrided the bursal surface of the supraspinatus.  We externally rotated the arm in order to evaluate the area of concern based on the MRI.  The tissue in the rotator interval was extremely friable and when I debrided this area the tissue was easily debrided away.  This left a defect in the rotator interval.  The supraspinatus and subscapularis tendons themselves were intact and the attachment was strong.  I used a tissue grasper to assess if a repair was possible but the tissue was not of good quality therefore gentle debridement was performed back to bleeding tissue.  The rotator cuff itself did not require repair.  Excess fluid was removed from the shoulder joint.  Incisions closed with interrupted nylon sutures.  Sterile dressings are applied.  Shoulder sling placed.  Patient tolerated procedure well had no many complications. Tessa Lerner, my PA, was a medical necessity for the entirety of the surgery including opening, closing, limb positioning, retracting, exposing, and repairing.  POSTOPERATIVE PLAN: Patient will be discharged home and follow-up in about 10 days for suture removal.  Mayra Reel, MD [facility] [phone] 1:34 PM

## 2020-10-11 NOTE — Transfer of Care (Signed)
Immediate Anesthesia Transfer of Care Note  Patient: Chidubem Chaires  Procedure(s) Performed: RIGHT SHOULDER ARTHROSCOPY WITH EXTENSIVE DEBRIDEMENT; DISTAL CLAVICLE EXCISION AND SUBACROMIAL DECOMPRESSION (Right Shoulder)  Patient Location: PACU  Anesthesia Type:General  Level of Consciousness: awake, alert , oriented, drowsy and patient cooperative  Airway & Oxygen Therapy: Patient Spontanous Breathing and Patient connected to face mask oxygen  Post-op Assessment: Report given to RN and Post -op Vital signs reviewed and stable  Post vital signs: Reviewed and stable  Last Vitals:  Vitals Value Taken Time  BP    Temp    Pulse    Resp    SpO2      Last Pain:  Vitals:   10/11/20 1015  TempSrc: Oral  PainSc: 0-No pain      Patients Stated Pain Goal: 4 (10/11/20 1015)  Complications: No complications documented.

## 2020-10-11 NOTE — Discharge Instructions (Signed)
Next dose of Tylenol can be given after 4:15PM.      Regional Anesthesia Blocks  1. Numbness or the inability to move the "blocked" extremity may last from 3-48 hours after placement. The length of time depends on the medication injected and your individual response to the medication. If the numbness is not going away after 48 hours, call your surgeon.  2. The extremity that is blocked will need to be protected until the numbness is gone and the  Strength has returned. Because you cannot feel it, you will need to take extra care to avoid injury. Because it may be weak, you may have difficulty moving it or using it. You may not know what position it is in without looking at it while the block is in effect.  3. For blocks in the legs and feet, returning to weight bearing and walking needs to be done carefully. You will need to wait until the numbness is entirely gone and the strength has returned. You should be able to move your leg and foot normally before you try and bear weight or walk. You will need someone to be with you when you first try to ensure you do not fall and possibly risk injury.  4. Bruising and tenderness at the needle site are common side effects and will resolve in a few days.  5. Persistent numbness or new problems with movement should be communicated to the surgeon or the [facility] [phone] [facility] [phone]).      Information for Discharge Teaching: EXPAREL (bupivacaine liposome injectable suspension)   Your surgeon or anesthesiologist gave you EXPAREL(bupivacaine) to help control your pain after surgery.   EXPAREL is a local anesthetic that provides pain relief by numbing the tissue around the surgical site.  EXPAREL is designed to release pain medication over time and can control pain for up to 72 hours.  Depending on how you respond to EXPAREL, you may require less pain medication during your recovery.  Possible side  effects:  Temporary loss of sensation or ability to move in the area where bupivacaine was injected.  Nausea, vomiting, constipation  Rarely, numbness and tingling in your mouth or lips, lightheadedness, or anxiety may occur.  Call your doctor right away if you think you may be experiencing any of these sensations, or if you have other questions regarding possible side effects.  Follow all other discharge instructions given to you by your surgeon or nurse. Eat a healthy diet and drink plenty of water or other fluids.  If you return to the hospital for any reason within 96 hours following the administration of EXPAREL, it is important for health care providers to know that you have received this anesthetic. A teal colored band has been placed on your arm with the date, time and amount of EXPAREL you have received in order to alert and inform your health care providers. Please leave this armband in place for the full 96 hours following administration, and then you may remove the band.      Post Anesthesia Home Care Instructions  Activity: Get plenty of rest for the remainder of the day. A responsible individual must stay with you for 24 hours following the procedure.  For the next 24 hours, DO NOT: -Drive a car -Advertising copywriter -Drink alcoholic beverages -Take any medication unless instructed by your physician -Make any legal decisions or sign important papers.  Meals: Start with liquid foods such as gelatin or soup. Progress  to regular foods as tolerated. Avoid greasy, spicy, heavy foods. If nausea and/or vomiting occur, drink only clear liquids until the nausea and/or vomiting subsides. Call your physician if vomiting continues.  Special Instructions/Symptoms: Your throat may feel dry or sore from the anesthesia or the breathing tube placed in your throat during surgery. If this causes discomfort, gargle with warm salt water. The discomfort should disappear within 24 hours.  If  you had a scopolamine patch placed behind your ear for the management of post- operative nausea and/or vomiting:  1. The medication in the patch is effective for 72 hours, after which it should be removed.  Wrap patch in a tissue and discard in the trash. Wash hands thoroughly with soap and water. 2. You may remove the patch earlier than 72 hours if you experience unpleasant side effects which may include dry mouth, dizziness or visual disturbances. 3. Avoid touching the patch. Wash your hands with soap and water after contact with the patch.              Post-operative patient instructions  Shoulder Arthroscopy   . Ice:  Place intermittent ice or cooler pack over your shoulder, 30 minutes on and 30 minutes off.  Continue this for the first 72 hours after surgery, then save ice for use after therapy sessions or on more active days.   . Weight:  You may bear weight on your arm as your symptoms allow. . Motion:  Perform gentle shoulder motion as tolerated . Dressing:  Perform 1st dressing change at 2 days postoperative. A moderate amount of blood tinged drainage is to be expected.  So if you bleed through the dressing on the first or second day or if you have fevers, it is fine to change the dressing/check the wounds early and redress wound.  If it bleeds through again, or if the incisions are leaking frank blood, please call the office. May change dressing every 1-2 days thereafter to help watch wounds. Can purchase Tegaderm (or 61M Nexcare) water resistant dressings at local pharmacy / [facility]. . Shower:  Light shower is ok after 2 days.  Please take shower, NO bath. Recover with gauze and ace wrap to help keep wounds protected.   . Pain medication:  A narcotic pain medication has been prescribed.  Take as directed.  Typically you need narcotic pain medication more regularly during the first 3 to 5 days after surgery.  Decrease your use of the medication as the pain improves.  Narcotics can  sometimes cause constipation, even after a few doses.  If you have problems with constipation, you can take an over the counter stool softener or light laxative.  If you have persistent problems, please notify your physician's office. Marland Kitchen Physical therapy: Additional activity guidelines to be provided by your physician or physical therapist at follow-up visits.  . Driving: Do not recommend driving x 2 weeks post surgical, especially if surgery performed on right side. Should not drive while taking narcotic pain medications. It typically takes at least 2 weeks to restore sufficient neuromuscular function for normal reaction times for driving safety.  . Call [phone] for questions or problems. Evenings you will be forwarded to the hospital operator.  Ask for the orthopaedic physician on call. Please call if you experience:    o Redness, foul smelling, or persistent drainage from the surgical site  o worsening shoulder pain and swelling not responsive to medication  o any calf pain and or swelling of the lower leg  o temperatures greater than 101.5 F o other questions or concerns   Thank you for allowing [place] to be a part of your care.

## 2020-10-11 NOTE — Anesthesia Procedure Notes (Signed)
Procedure Name: Intubation Date/Time: 10/11/2020 11:58 AM Performed by: Jonna Munro, CRNA Pre-anesthesia Checklist: Patient identified, Emergency Drugs available, Suction available, Patient being monitored and Timeout performed Patient Re-evaluated:Patient Re-evaluated prior to induction Oxygen Delivery Method: Circle system utilized Preoxygenation: Pre-oxygenation with 100% oxygen Induction Type: IV induction Ventilation: Mask ventilation without difficulty Laryngoscope Size: Mac and 4 Grade View: Grade I Tube type: Oral Number of attempts: 1 Airway Equipment and Method: Stylet Placement Confirmation: positive ETCO2,  ETT inserted through vocal cords under direct vision and breath sounds checked- equal and bilateral Secured at: 23 cm Tube secured with: Tape Dental Injury: Teeth and Oropharynx as per pre-operative assessment

## 2020-10-11 NOTE — H&P (Signed)
    PREOPERATIVE H&P  Chief Complaint: right shoulder rotator cuff tear  HPI: Jeremiah Gray is a 40 y.o. male who presents for surgical treatment of right shoulder rotator cuff tear.  He denies any changes in medical history.  Past Medical History:  Diagnosis Date  . Seasonal allergies    Past Surgical History:  Procedure Laterality Date  . SHOULDER SURGERY    . WRIST SURGERY     Social History   Socioeconomic History  . Marital status: Married    Spouse name: Not on file  . Number of children: Not on file  . Years of education: Not on file  . Highest education level: Not on file  Occupational History  . Not on file  Tobacco Use  . Smoking status: Never Smoker  . Smokeless tobacco: Never Used  Substance and Sexual Activity  . Alcohol use: Yes    Comment: occas  . Drug use: Never  . Sexual activity: Not on file  Other Topics Concern  . Not on file  Social History Narrative  . Not on file   Social Determinants of Health   Financial Resource Strain: Not on file  Food Insecurity: Not on file  Transportation Needs: Not on file  Physical Activity: Not on file  Stress: Not on file  Social Connections: Not on file   History reviewed. No pertinent family history. No Known Allergies Prior to Admission medications   Medication Sig Start Date End Date Taking? Authorizing Provider  fexofenadine (ALLEGRA) 30 MG/5ML suspension Take by mouth.   Yes [provider]  Multiple Vitamin (MULTIVITAMIN) capsule Take 1 capsule by mouth daily.   Yes [provider]     Positive ROS: All other systems have been reviewed and were otherwise negative with the exception of those mentioned in the HPI and as above.  Physical Exam: General: Alert, no acute distress Cardiovascular: No pedal edema Respiratory: No cyanosis, no use of accessory musculature GI: abdomen soft Skin: No lesions in the area of chief complaint Neurologic: Sensation intact distally Psychiatric:  Patient is competent for consent with normal mood and affect Lymphatic: no lymphedema  MUSCULOSKELETAL: exam stable  Assessment: right shoulder rotator cuff tear  Plan: Plan for Procedure(s): RIGHT SHOULDER ARTHROSCOPY WITH ROTATOR CUFF REPAIR, DISTAL CLAVICLE EXCISION AND SUBACROMIAL DECOMPRESSION  The risks benefits and alternatives were discussed with the patient including but not limited to the risks of nonoperative treatment, versus surgical intervention including infection, bleeding, nerve injury,  blood clots, cardiopulmonary complications, morbidity, mortality, among others, and they were willing to proceed.   Preoperative templating of the joint replacement has been completed, documented, and submitted to the Operating Room personnel in order to optimize intra-operative equipment management.   Glee Arvin, MD 10/11/2020 10:01 AM

## 2020-10-11 NOTE — Progress Notes (Signed)
Assisted Dr. Howze with right, ultrasound guided, interscalene  block. Side rails up, monitors on throughout procedure. See vital signs in flow sheet. Tolerated Procedure well. °

## 2020-10-11 NOTE — Anesthesia Procedure Notes (Addendum)
Anesthesia Regional Block: Interscalene brachial plexus block   Pre-Anesthetic Checklist: ,, timeout performed, Correct Patient, Correct Site, Correct Laterality, Correct Procedure, Correct Position, site marked, Risks and benefits discussed, pre-op evaluation,  At surgeon's request and post-op pain management  Laterality: Right  Prep: Maximum Sterile Barrier Precautions used, chloraprep       Needles:  Injection technique: Single-shot  Needle Type: Echogenic Stimulator Needle     Needle Length: 4cm  Needle Gauge: 22     Additional Needles:   Procedures:,,,, ultrasound used (permanent image in chart),,,,  Narrative:  Start time: 10/11/2020 11:25 AM End time: 10/11/2020 11:28 AM Injection made incrementally with aspirations every 5 mL.  Performed by: Personally  Anesthesiologist: Kaylyn Layer, MD  Additional Notes: Risks, benefits, and alternative discussed. Patient gave consent for procedure. Patient prepped and draped in sterile fashion. Sedation administered, patient remains easily responsive to voice. Relevant anatomy identified with ultrasound guidance. Local anesthetic given in 5cc increments with no signs or symptoms of intravascular injection. No pain or paraesthesias with injection. Patient monitored throughout procedure with signs of LAST or immediate complications. Tolerated well. Ultrasound image placed in chart.  Jeremiah Greenhouse, MD

## 2020-10-11 NOTE — Anesthesia Postprocedure Evaluation (Signed)
Anesthesia Post Note  Patient: Jeremiah Gray  Procedure(s) Performed: RIGHT SHOULDER ARTHROSCOPY WITH EXTENSIVE DEBRIDEMENT; DISTAL CLAVICLE EXCISION AND SUBACROMIAL DECOMPRESSION (Right Shoulder)     Patient location during evaluation: PACU Anesthesia Type: General Level of consciousness: awake and alert and oriented Pain management: pain level controlled Vital Signs Assessment: post-procedure vital signs reviewed and stable Respiratory status: spontaneous breathing, nonlabored ventilation and respiratory function stable Cardiovascular status: blood pressure returned to baseline Postop Assessment: no apparent nausea or vomiting Anesthetic complications: no   No complications documented.  Last Vitals:  Vitals:   10/11/20 1404 10/11/20 1415  BP:  116/64  Pulse: 71 60  Resp: 12 12  Temp:    SpO2: 100% 96%    Last Pain:  Vitals:   10/11/20 1415  TempSrc:   PainSc: 0-No pain                 Kaylyn Layer

## 2020-10-14 NOTE — Addendum Note (Signed)
Addendum  created 10/14/20 3893 by Ariyel Jeangilles, Jewel Baize, CRNA   Charge Capture section accepted

## 2020-10-18 ENCOUNTER — Ambulatory Visit (INDEPENDENT_AMBULATORY_CARE_PROVIDER_SITE_OTHER): Payer: BC Managed Care – PPO | Admitting: Physician Assistant

## 2020-10-18 DIAGNOSIS — Z9889 Other specified postprocedural states: Secondary | ICD-10-CM

## 2020-10-18 NOTE — Progress Notes (Signed)
   Post-Op Visit Note   Patient: Jeremiah Gray           Date of Birth: April 23, 1981           MRN: [id number] Visit Date: 10/18/2020 PCP: Adrienne Mocha, PA   Assessment & Plan:  Chief Complaint:  Chief Complaint  Patient presents with  . Right Shoulder - Routine Post Op   Visit Diagnoses:  1. S/P arthroscopy of right shoulder     Plan: Patient is a pleasant 40 year old gentleman who comes in today 1 week out right shoulder arthroscopic debridement, subacromial decompression and distal clavicle excision.  He has been doing well.  He has minimal pain.  Examination of his right shoulder reveals well-healed surgical portals with nylon sutures in place.  No evidence of infection or cellulitis.  He is neurovascular intact distally.  Today, sutures were removed and Steri-Strips applied.  Intraoperative pictures reviewed.  We discussed physical therapy and have provided him with a hard prescription which she will take to a place in close proximity to his house.  He will follow up with [place] in 5 weeks time for repeat evaluation.  Call with concerns or questions.  Follow-Up Instructions: Return in about 5 weeks (around 11/22/2020).   Orders:  No orders of the defined types were placed in this encounter.  No orders of the defined types were placed in this encounter.   Imaging: No new imaging  PMFS History: Patient Active Problem List   Diagnosis Date Noted  . Tear of right supraspinatus tendon 09/03/2020  . 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 Pain in left wrist 02/24/2019   Past Medical History:  Diagnosis Date  . Kidney stone 2002  . Seasonal allergies     No family history on file.  Past Surgical History:  Procedure Laterality Date  . SHOULDER SURGERY    . WRIST SURGERY     Social History   Occupational History  . Not on file  Tobacco Use  . Smoking status: Former Smoker    Quit date: 2006    Years since quitting: 16.0  . Smokeless tobacco: Never Used   Substance and Sexual Activity  . Alcohol use: Yes    Comment: occas  . Drug use: Never  . Sexual activity: Not on file

## 2020-11-29 ENCOUNTER — Encounter: Payer: Self-pay | Admitting: Orthopaedic Surgery

## 2020-11-29 ENCOUNTER — Ambulatory Visit (INDEPENDENT_AMBULATORY_CARE_PROVIDER_SITE_OTHER): Payer: BC Managed Care – PPO | Admitting: Physician Assistant

## 2020-11-29 DIAGNOSIS — Z9889 Other specified postprocedural states: Secondary | ICD-10-CM

## 2020-11-29 NOTE — Progress Notes (Signed)
   Post-Op Visit Note   Patient: Jeremiah Gray           Date of Birth: 1980/10/10           MRN: [id number] Visit Date: 11/29/2020 PCP: Adrienne Mocha, PA   Assessment & Plan:  Chief Complaint:  Chief Complaint  Patient presents with  . Right Shoulder - Routine Post Op   Visit Diagnoses:  1. S/P arthroscopy of right shoulder     Plan: Patient is a pleasant 40 year old gentleman who comes in today 7 weeks out right shoulder arthroscopic decompression.  He has been doing very well.  He has been in physical therapy making great progress.  He still lacks a little of strength but is improving every day.  Examination of his right shoulder reveals full active range of motion all planes.  4 out of 5 strength with resisted internal and external rotation.  He is neurovascularly intact distally.  At this point, I believe he can start doing therapy on his own at home.  We will continue to advance with activity as tolerated.  Follow-up with [place] in 5 weeks time for recheck.  Call with concerns or questions in the meantime.  Follow-Up Instructions: Return in about 5 weeks (around 01/03/2021).   Orders:  No orders of the defined types were placed in this encounter.  No orders of the defined types were placed in this encounter.   Imaging: No new imaging  PMFS History: Patient Active Problem List   Diagnosis Date Noted  . Tear of right supraspinatus tendon 09/03/2020  . 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 Pain in left wrist 02/24/2019   Past Medical History:  Diagnosis Date  . Kidney stone 2002  . Seasonal allergies     History reviewed. No pertinent family history.  Past Surgical History:  Procedure Laterality Date  . SHOULDER SURGERY    . WRIST SURGERY     Social History   Occupational History  . Not on file  Tobacco Use  . Smoking status: Former Smoker    Quit date: 2006    Years since quitting: 16.2  . Smokeless tobacco: Never Used  Substance and Sexual  Activity  . Alcohol use: Yes    Comment: occas  . Drug use: Never  . Sexual activity: Not on file

## 2021-01-02 ENCOUNTER — Encounter: Payer: Self-pay | Admitting: Orthopaedic Surgery

## 2021-01-02 ENCOUNTER — Ambulatory Visit (INDEPENDENT_AMBULATORY_CARE_PROVIDER_SITE_OTHER): Payer: BC Managed Care – PPO | Admitting: Orthopaedic Surgery

## 2021-01-02 DIAGNOSIS — M19011 Primary osteoarthritis, right shoulder: Secondary | ICD-10-CM

## 2021-01-02 DIAGNOSIS — Z9889 Other specified postprocedural states: Secondary | ICD-10-CM

## 2021-01-02 NOTE — Progress Notes (Signed)
   Post-Op Visit Note   Patient: Jeremiah Gray           Date of Birth: 06-Mar-1981           MRN: [id number] Visit Date: 01/02/2021 PCP: Adrienne Mocha, PA   Assessment & Plan:  Chief Complaint:  Chief Complaint  Patient presents with  . Right Shoulder - Pain   Visit Diagnoses:  1. S/P arthroscopy of right shoulder   2. Arthrosis of right acromioclavicular joint     Plan:   Jeremiah Gray is approximately 12 weeks status post right shoulder scope.  He states that he is doing very well overall.  He has some occasional pain.  He has started back with some light gym activities.  He has no real complaints.  Right shoulder shows near normal range of motion in all planes.  Strength of rotator cuff is near normal.  At this point I will release him to activities as tolerated.  He is comfortable with just doing the gym exercises and taken the easiest to prevent future injury.  Questions encouraged and answered.  Follow-up as needed.   Follow-Up Instructions: Return if symptoms worsen or fail to improve.   Orders:  No orders of the defined types were placed in this encounter.  No orders of the defined types were placed in this encounter.   Imaging: No results found.  PMFS History: Patient Active Problem List   Diagnosis Date Noted  . Tear of right supraspinatus tendon 09/03/2020  . 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 Pain in left wrist 02/24/2019   Past Medical History:  Diagnosis Date  . Kidney stone 2002  . Seasonal allergies     History reviewed. No pertinent family history.  Past Surgical History:  Procedure Laterality Date  . SHOULDER SURGERY    . WRIST SURGERY     Social History   Occupational History  . Not on file  Tobacco Use  . Smoking status: Former Smoker    Quit date: 2006    Years since quitting: 16.2  . Smokeless tobacco: Never Used  Substance and Sexual Activity  . Alcohol use: Yes    Comment: occas  . Drug use: Never  . Sexual  activity: Not on file

## 2022-03-04 ENCOUNTER — Other Ambulatory Visit: Payer: Self-pay | Admitting: Physician Assistant

## 2022-03-04 DIAGNOSIS — R1032 Left lower quadrant pain: Secondary | ICD-10-CM

## 2022-03-11 ENCOUNTER — Ambulatory Visit
Admission: RE | Admit: 2022-03-11 | Discharge: 2022-03-11 | Disposition: A | Discharge status: Home / Self Care | Payer: BC Managed Care – PPO | Source: Ambulatory Visit | Attending: Physician Assistant | Admitting: Physician Assistant

## 2022-03-11 DIAGNOSIS — R1032 Left lower quadrant pain: Secondary | ICD-10-CM

## 2022-03-23 ENCOUNTER — Other Ambulatory Visit: Payer: Self-pay | Admitting: Physician Assistant

## 2022-03-23 DIAGNOSIS — R1032 Left lower quadrant pain: Secondary | ICD-10-CM

## 2022-04-01 ENCOUNTER — Ambulatory Visit
Admission: RE | Admit: 2022-04-01 | Discharge: 2022-04-01 | Disposition: A | Discharge status: Home / Self Care | Payer: BC Managed Care – PPO | Source: Ambulatory Visit | Attending: Physician Assistant | Admitting: Physician Assistant

## 2022-04-01 DIAGNOSIS — R1032 Left lower quadrant pain: Secondary | ICD-10-CM

## 2022-04-01 MED ORDER — IOPAMIDOL (ISOVUE-300) INJECTION 61%
100.0000 mL | Freq: Once | INTRAVENOUS | Status: AC | PRN
  Administered 2022-04-01: 100 mL via INTRAVENOUS

## 2022-04-17 ENCOUNTER — Ambulatory Visit: Payer: BC Managed Care – PPO | Admitting: Orthopaedic Surgery

## 2022-04-17 ENCOUNTER — Ambulatory Visit (INDEPENDENT_AMBULATORY_CARE_PROVIDER_SITE_OTHER): Payer: BC Managed Care – PPO

## 2022-04-17 DIAGNOSIS — M25552 Pain in left hip: Secondary | ICD-10-CM

## 2022-04-17 NOTE — Progress Notes (Signed)
Office Visit Note   Patient: Jeremiah Gray           Date of Birth: January 17, 1981           MRN: [id number] Visit Date: 04/17/2022              Requested by: No referring provider defined for this encounter. PCP: Adrienne Mocha, PA (Inactive)   Assessment & Plan: Visit Diagnoses:  1. Pain in left hip     Plan: Impression is left hip pain concerning for labral tear.  Will need MR arthrogram to assess.  I will communicate through MyChart regarding the results and further steps.  Follow-Up Instructions: No follow-ups on file.   Orders:  Orders Placed This Encounter  Procedures   XR HIP UNILAT W OR W/O PELVIS 2-3 VIEWS LEFT   Arthrogram   MR Hip Left w/ contrast   No orders of the defined types were placed in this encounter.     Procedures: No procedures performed   Clinical Data: No additional findings.   Subjective: Chief Complaint  Patient presents with   Left Hip - Pain    HPI Jeremiah Gray is a 41 year old gentleman who is well-known to me who comes in for left hip and groin pain since January.  Denies any injuries.  Has been doing physical therapy and recently had a CT scan which was negative.  Had ultrasound of his abdomen and groin region which were also normal.  Has trouble sleeping.  Feels groin pain when turning his foot in.  Cannot run or go up stairs.  Review of Systems  Constitutional: Negative.   All other systems reviewed and are negative.    Objective: Vital Signs: There were no vitals taken for this visit.  Physical Exam Vitals and nursing note reviewed.  Constitutional:      Appearance: He is well-developed.  HENT:     Head: Normocephalic and atraumatic.  Eyes:     Pupils: Pupils are equal, round, and reactive to light.  Pulmonary:     Effort: Pulmonary effort is normal.  Abdominal:     Palpations: Abdomen is soft.  Musculoskeletal:        General: Normal range of motion.     Cervical back: Neck supple.  Skin:    General: Skin is warm.   Neurological:     Mental Status: He is alert and oriented to person, place, and time.  Psychiatric:        Behavior: Behavior normal.        Thought Content: Thought content normal.        Judgment: Judgment normal.     Ortho Exam Examination left hip shows pain with internal rotation and FADIR and hip flexion.  No sciatic tension signs.  Positive Stinchfield sign. Specialty Comments:  No specialty comments available.  Imaging: XR HIP UNILAT W OR W/O PELVIS 2-3 VIEWS LEFT  Result Date: 04/17/2022 No acute or structure abnormalities.    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  Past Surgical History:  Procedure Laterality Date   SHOULDER SURGERY     WRIST SURGERY     Social History   Occupational History   Not  on file  Tobacco Use   Smoking status: Former    Types: Cigarettes    Quit date: 2006    Years since quitting: 17.5   Smokeless tobacco: Never  Substance and Sexual Activity   Alcohol use: Yes    Comment: occas   Drug use: Never   Sexual activity: Not on file

## 2022-04-30 ENCOUNTER — Ambulatory Visit
Admission: RE | Admit: 2022-04-30 | Discharge: 2022-04-30 | Disposition: A | Discharge status: Home / Self Care | Payer: BC Managed Care – PPO | Source: Ambulatory Visit | Attending: Orthopaedic Surgery | Admitting: Orthopaedic Surgery

## 2022-04-30 DIAGNOSIS — M25552 Pain in left hip: Secondary | ICD-10-CM

## 2022-04-30 MED ORDER — IOPAMIDOL (ISOVUE-M 200) INJECTION 41%
18.0000 mL | Freq: Once | INTRAMUSCULAR | Status: AC
Start: 2022-04-30 — End: 2022-04-30
  Administered 2022-04-30: 18 mL via INTRA_ARTICULAR

## 2022-05-02 ENCOUNTER — Encounter: Payer: Self-pay | Admitting: Orthopaedic Surgery

## 2022-05-04 ENCOUNTER — Telehealth: Payer: Self-pay | Admitting: Orthopaedic Surgery

## 2022-05-04 NOTE — Telephone Encounter (Signed)
Called patient left message to return call to schedule an appointment per mychart message from patient    Office visit

## 2022-05-12 ENCOUNTER — Ambulatory Visit: Payer: BC Managed Care – PPO | Admitting: Orthopaedic Surgery

## 2022-05-13 ENCOUNTER — Ambulatory Visit: Payer: BC Managed Care – PPO | Admitting: Orthopaedic Surgery

## 2022-05-13 DIAGNOSIS — M87052 Idiopathic aseptic necrosis of left femur: Secondary | ICD-10-CM | POA: Diagnosis not present

## 2022-05-13 NOTE — Progress Notes (Signed)
Office Visit Note   Patient: Jeremiah Gray           Date of Birth: 1981/08/01           MRN: [id number] Visit Date: 05/13/2022              Requested by: No referring provider defined for this encounter. PCP: Adrienne Mocha, PA (Inactive)   Assessment & Plan: Visit Diagnoses:  1. Avascular necrosis of hip, left (HCC)     Plan: MRI of the left hip shows findings consistent with avascular necrosis.  There is less than 50% involvement the femoral head.  There are a couple spots of bony edema.  I do not see any articular collapse.  These findings were reviewed with [facility] today and treatment options were explained to include bisphosphonate, core decompression, symptomatic treatment and activity modifications, total hip replacement.  Based on our discussion and later phone call we agree that the best course of action is for continued symptomatic treatment and activity modifications.  He will follow-up if anything changes.  Follow-Up Instructions: No follow-ups on file.   Orders:  No orders of the defined types were placed in this encounter.  No orders of the defined types were placed in this encounter.     Procedures: No procedures performed   Clinical Data: No additional findings.   Subjective: Chief Complaint  Patient presents with   Left Hip - Pain    HPI Patient returns today to review left hip MRI scan. Review of Systems  Constitutional: Negative.   All other systems reviewed and are negative.    Objective: Vital Signs: There were no vitals taken for this visit.  Physical Exam Vitals and nursing note reviewed.  Constitutional:      Appearance: He is well-developed.  HENT:     Head: Normocephalic and atraumatic.  Eyes:     Pupils: Pupils are equal, round, and reactive to light.  Pulmonary:     Effort: Pulmonary effort is normal.  Abdominal:     Palpations: Abdomen is soft.  Musculoskeletal:        General: Normal range of motion.     Cervical back: Neck  supple.  Skin:    General: Skin is warm.  Neurological:     Mental Status: He is alert and oriented to person, place, and time.  Psychiatric:        Behavior: Behavior normal.        Thought Content: Thought content normal.        Judgment: Judgment normal.     Ortho Exam Examination of the left hip is unchanged. Specialty Comments:  No specialty comments available.  Imaging: No results found.   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  Past Surgical History:  Procedure Laterality Date   SHOULDER SURGERY     WRIST SURGERY     Social History   Occupational History   Not on file  Tobacco Use   Smoking status: Former    Types: Cigarettes    Quit date: 2006    Years since quitting: 17.6   Smokeless tobacco: Never  Substance and  Sexual Activity   Alcohol use: Yes    Comment: occas   Drug use: Never   Sexual activity: Not on file

## 2023-04-17 IMAGING — US US PELVIS LIMITED
1 series · 14 of 16 positions shown · non-contrast
Comparison: None Available.

CLINICAL DATA: Left inguinal pain x6 months.  Palpable nodule.

EXAM:
LIMITED ULTRASOUND OF PELVIS
TECHNIQUE: Limited transabdominal ultrasound examination of the pelvis was
performed.

[Series 1: us pelvis limited · 0.06mm/px · 16 acquisitions, 14 frames shown]
[im 1/16]
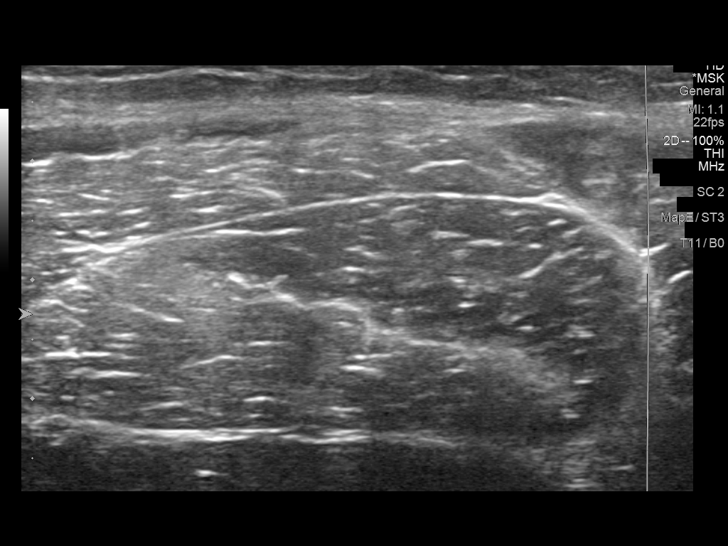
[im 2/16]
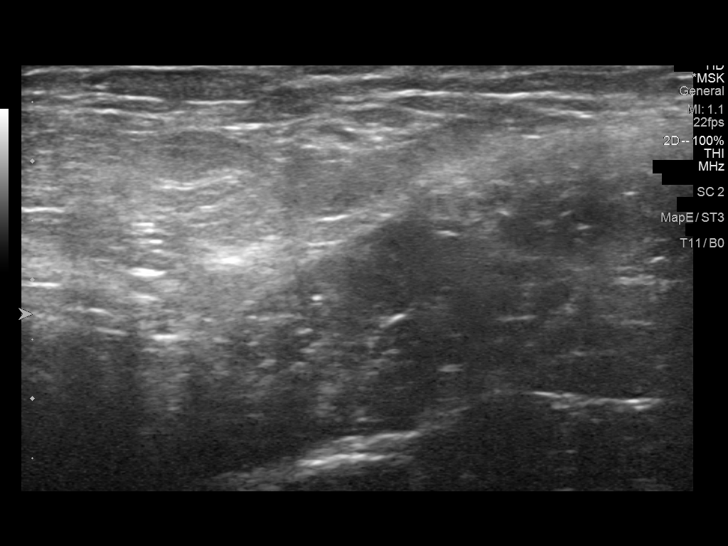
[im 3/16]
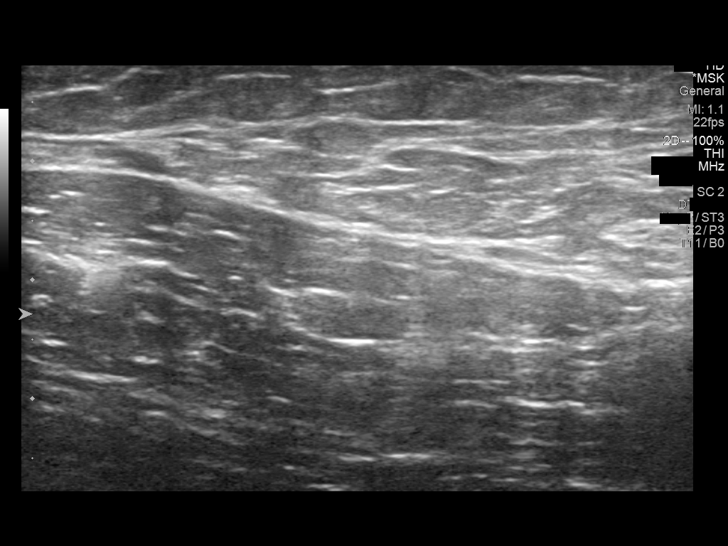
[im 5/16]
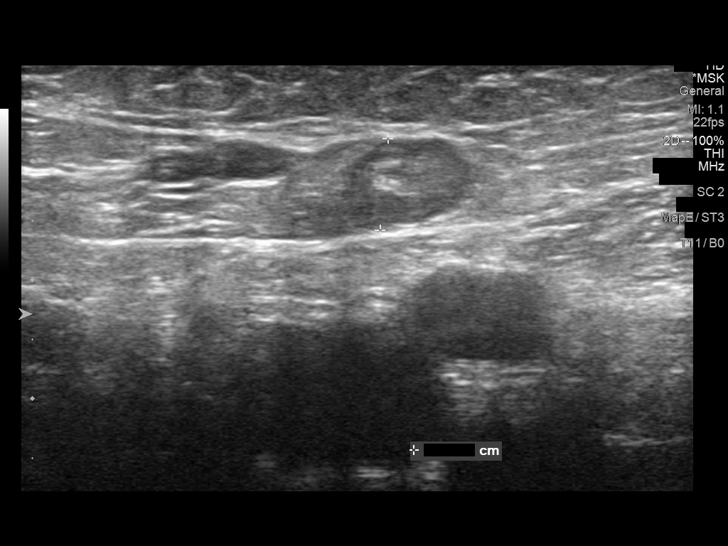
[im 6/16]
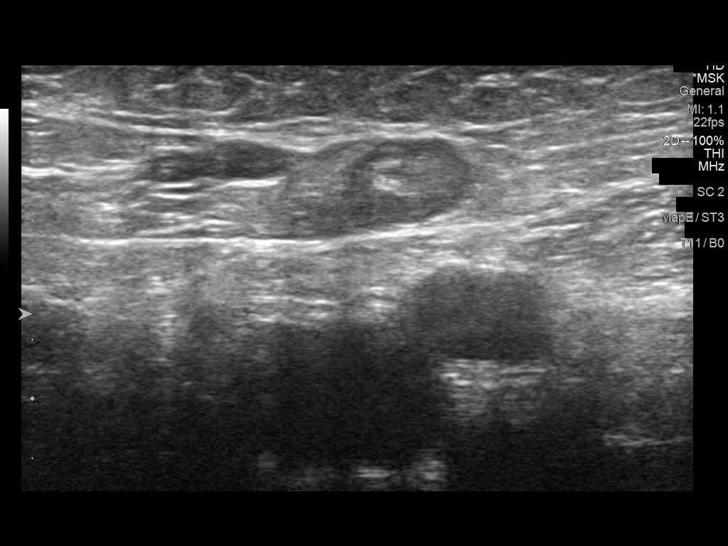
[im 7/16]
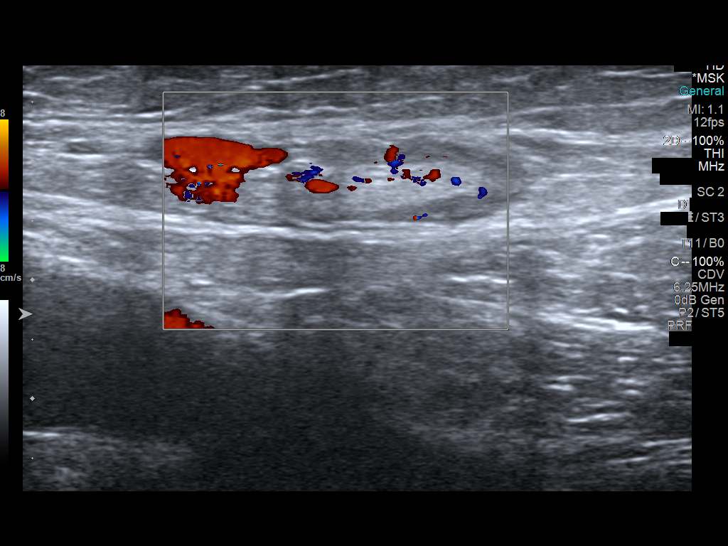
[im 8/16]
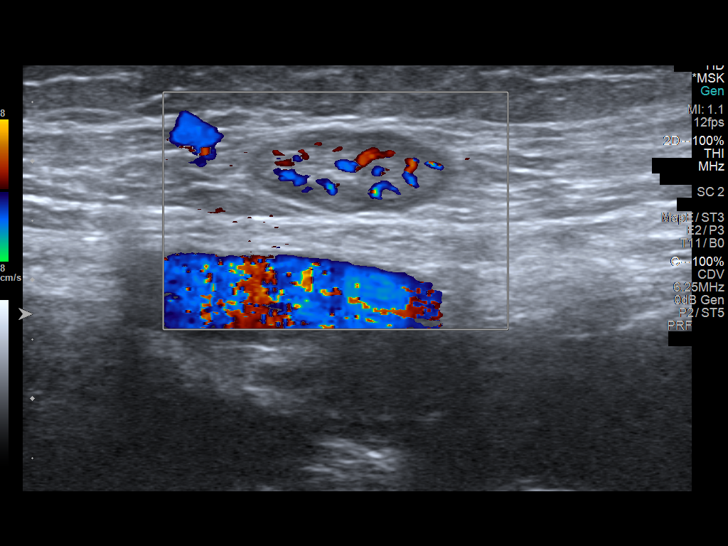
[im 9/16]
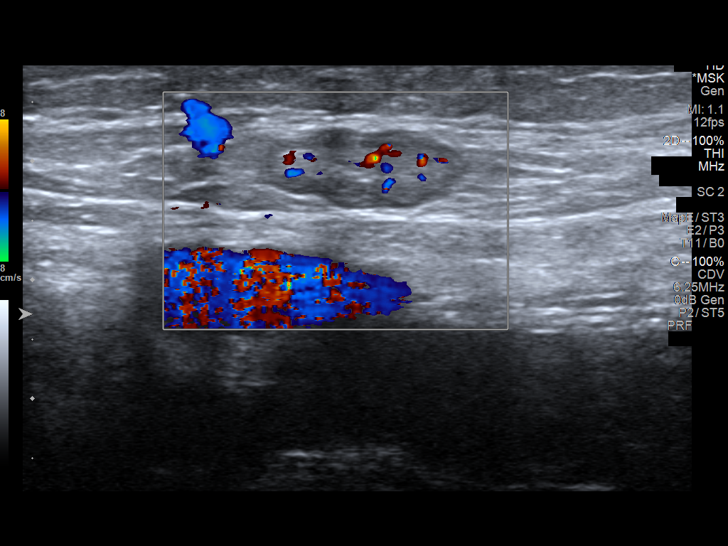
[im 10/16]
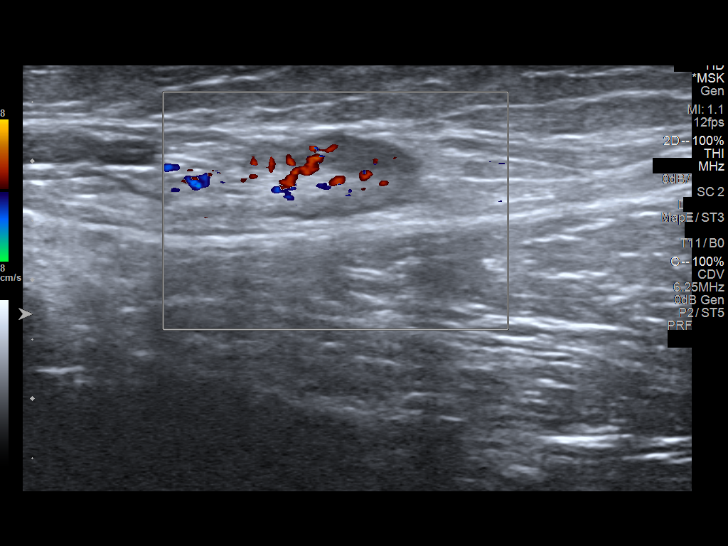
[im 11/16]
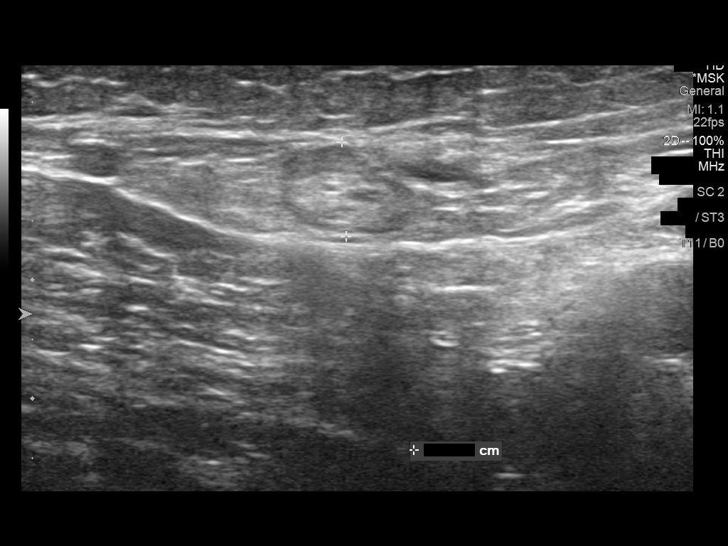
[im 13/16]
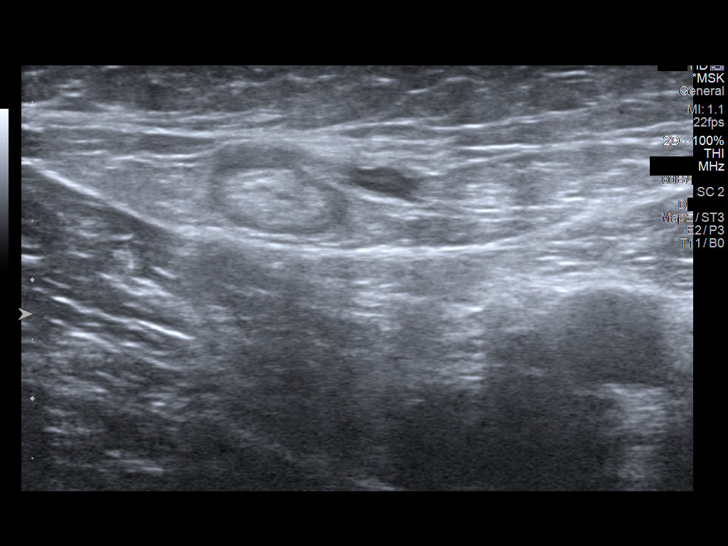
[im 14/16]
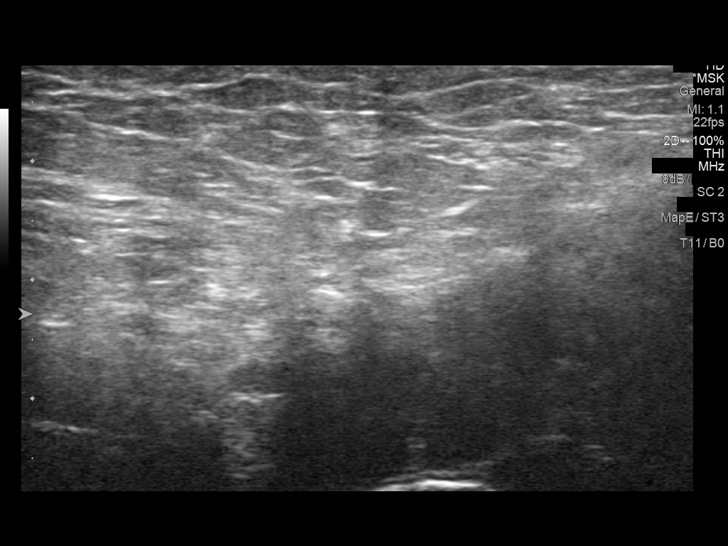
[im 15/16]
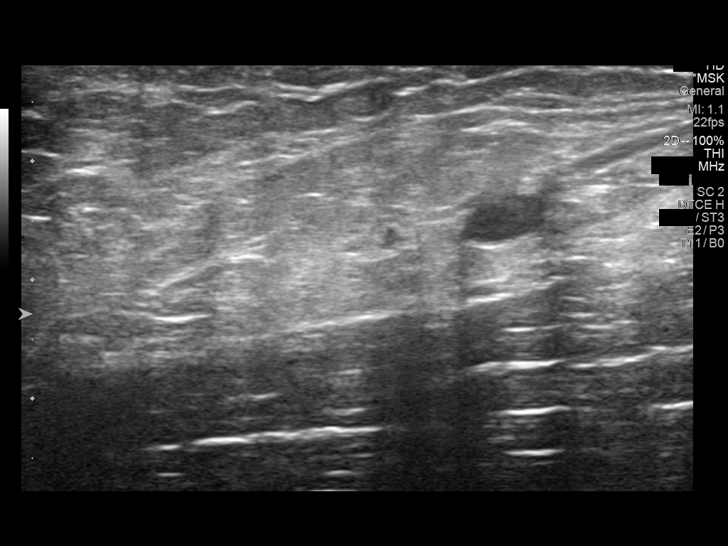
[im 16/16]
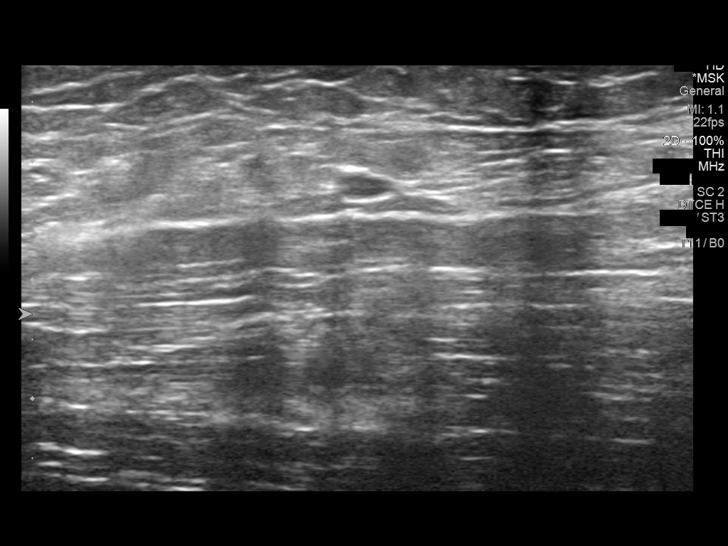

[14 of 16 positions shown; findings below may reference images not displayed]

FINDINGS: Several lymph nodes measuring up to 8 mm in short axis. These lymph
nodes demonstrate a fatty hilum and normal morphology. There may be
mild hyperemia, likely reactive. Clinical correlation is
recommended.

No mass, fluid collection, architectural distortion, or abnormal
vascularity. No hernia.
IMPRESSION: Top-normal, possibly reactive, lymph nodes.
